# Patient Record
Sex: Female | Born: 1965 | Race: White | Hispanic: No
Health system: Southern US, Community
[De-identification: ages and names within clinical notes are randomized; demographics above are authoritative.]

## PROBLEM LIST (undated history)

## (undated) DIAGNOSIS — J9621 Acute and chronic respiratory failure with hypoxia: Secondary | ICD-10-CM

## (undated) DIAGNOSIS — J4489 Other specified chronic obstructive pulmonary disease: Secondary | ICD-10-CM

## (undated) DIAGNOSIS — J449 Chronic obstructive pulmonary disease, unspecified: Secondary | ICD-10-CM

## (undated) DIAGNOSIS — Z86718 Personal history of other venous thrombosis and embolism: Secondary | ICD-10-CM

## (undated) DIAGNOSIS — J690 Pneumonitis due to inhalation of food and vomit: Secondary | ICD-10-CM

## (undated) DIAGNOSIS — F1920 Other psychoactive substance dependence, uncomplicated: Secondary | ICD-10-CM

---

## 2016-06-12 ENCOUNTER — Other Ambulatory Visit (HOSPITAL_COMMUNITY): Payer: Medicare Other

## 2016-06-12 ENCOUNTER — Inpatient Hospital Stay
Admission: RE | Admit: 2016-06-12 | Discharge: 2016-06-30 | Disposition: A | Discharge status: Skilled Nursing Facility | Payer: Medicare Other | Attending: Internal Medicine | Admitting: Internal Medicine

## 2016-06-12 DIAGNOSIS — Z4659 Encounter for fitting and adjustment of other gastrointestinal appliance and device: Secondary | ICD-10-CM

## 2016-06-12 DIAGNOSIS — J9690 Respiratory failure, unspecified, unspecified whether with hypoxia or hypercapnia: Secondary | ICD-10-CM

## 2016-06-12 DIAGNOSIS — Z0189 Encounter for other specified special examinations: Secondary | ICD-10-CM

## 2016-06-13 ENCOUNTER — Other Ambulatory Visit (HOSPITAL_COMMUNITY): Payer: Medicare Other

## 2016-06-13 LAB — COMPREHENSIVE METABOLIC PANEL
ALT: 362 U/L — ABNORMAL HIGH (ref 14–54)
AST: 342 U/L — ABNORMAL HIGH (ref 15–41)
Albumin: 2.3 g/dL — ABNORMAL LOW (ref 3.5–5.0)
Alkaline Phosphatase: 200 U/L — ABNORMAL HIGH (ref 38–126)
Anion gap: 9 (ref 5–15)
BUN: 22 mg/dL — ABNORMAL HIGH (ref 6–20)
CO2: 28 mmol/L (ref 22–32)
Calcium: 9.8 mg/dL (ref 8.9–10.3)
Chloride: 104 mmol/L (ref 101–111)
Creatinine, Ser: 0.52 mg/dL (ref 0.44–1.00)
GFR calc Af Amer: >60 mL/min (ref >60)
GFR calc non Af Amer: >60 mL/min (ref >60)
Glucose, Bld: 93 mg/dL (ref 65–99)
Potassium: 3.7 mmol/L (ref 3.5–5.1)
Sodium: 141 mmol/L (ref 135–145)
Total Bilirubin: 0.6 mg/dL (ref 0.3–1.2)
Total Protein: 6.6 g/dL (ref 6.5–8.1)

## 2016-06-13 LAB — CBC
HCT: 33.2 % — ABNORMAL LOW (ref 36.0–46.0)
Hemoglobin: 10.7 g/dL — ABNORMAL LOW (ref 12.0–15.0)
MCH: 29.5 pg (ref 26.0–34.0)
MCHC: 32.2 g/dL (ref 30.0–36.0)
MCV: 91.5 fL (ref 78.0–100.0)
Platelets: 708 10*3/uL — ABNORMAL HIGH (ref 150–400)
RBC: 3.63 MIL/uL — ABNORMAL LOW (ref 3.87–5.11)
RDW: 16.3 % — ABNORMAL HIGH (ref 11.5–15.5)
WBC: 18.5 10*3/uL — ABNORMAL HIGH (ref 4.0–10.5)

## 2016-06-13 LAB — URINALYSIS, ROUTINE W REFLEX MICROSCOPIC
Bilirubin Urine: NEGATIVE
Glucose, UA: NEGATIVE mg/dL
Ketones, ur: NEGATIVE mg/dL
Leukocytes, UA: NEGATIVE
NITRITE: NEGATIVE
PH: 8 (ref 5.0–8.0)
Protein, ur: NEGATIVE mg/dL
SPECIFIC GRAVITY, URINE: 1.009 (ref 1.005–1.030)

## 2016-06-13 LAB — BLOOD GAS, ARTERIAL
Acid-Base Excess: 3.9 mmol/L — ABNORMAL HIGH (ref 0.0–2.0)
Acid-Base Excess: 2.4 mmol/L — ABNORMAL HIGH (ref 0.0–2.0)
Bicarbonate: 27 mmol/L (ref 20.0–28.0)
Bicarbonate: 25.1 mmol/L (ref 20.0–28.0)
O2 Content: 4 L/min
O2 Content: 3 L/min
O2 SAT: 89.2 %
O2 Saturation: 84.6 %
PCO2 ART: 30.3 mmHg — AB (ref 32.0–48.0)
Patient temperature: 97.6
Patient temperature: 98.6
pCO2 arterial: 33.6 mmHg (ref 32.0–48.0)
pH, Arterial: 7.513 — ABNORMAL HIGH (ref 7.350–7.450)
pH, Arterial: 7.529 — ABNORMAL HIGH (ref 7.350–7.450)
pO2, Arterial: 50.6 mmHg — ABNORMAL LOW (ref 83.0–108.0)
pO2, Arterial: 60.7 mmHg — ABNORMAL LOW (ref 83.0–108.0)

## 2016-06-13 LAB — HEPARIN LEVEL (UNFRACTIONATED)
HEPARIN UNFRACTIONATED: 0.48 [IU]/mL (ref 0.30–0.70)
Heparin Unfractionated: 0.27 IU/mL — ABNORMAL LOW (ref 0.30–0.70)
Heparin Unfractionated: 0.97 IU/mL — ABNORMAL HIGH (ref 0.30–0.70)

## 2016-06-13 LAB — PROTIME-INR
INR: 1.15
Prothrombin Time: 14.7 seconds (ref 11.4–15.2)

## 2016-06-13 LAB — PHOSPHORUS: Phosphorus: 2.7 mg/dL (ref 2.5–4.6)

## 2016-06-13 LAB — LIPASE, BLOOD: LIPASE: 33 U/L (ref 11–51)

## 2016-06-13 LAB — MAGNESIUM: Magnesium: 2.3 mg/dL (ref 1.7–2.4)

## 2016-06-13 LAB — T4, FREE: FREE T4: 1.16 ng/dL — AB (ref 0.61–1.12)

## 2016-06-13 LAB — TRIGLYCERIDES: Triglycerides: 347 mg/dL — ABNORMAL HIGH (ref <150)

## 2016-06-13 LAB — AMYLASE: AMYLASE: 91 U/L (ref 28–100)

## 2016-06-13 LAB — APTT: aPTT: 59 seconds — ABNORMAL HIGH (ref 24–36)

## 2016-06-14 LAB — MAGNESIUM: MAGNESIUM: 2.1 mg/dL (ref 1.7–2.4)

## 2016-06-14 LAB — CBC WITH DIFFERENTIAL/PLATELET
BASOS ABS: 0 10*3/uL (ref 0.0–0.1)
BASOS PCT: 0 %
EOS PCT: 0 %
Eosinophils Absolute: 0 10*3/uL (ref 0.0–0.7)
HCT: 34.5 % — ABNORMAL LOW (ref 36.0–46.0)
Hemoglobin: 11.1 g/dL — ABNORMAL LOW (ref 12.0–15.0)
Lymphocytes Relative: 21 %
Lymphs Abs: 2.7 10*3/uL (ref 0.7–4.0)
MCH: 28.8 pg (ref 26.0–34.0)
MCHC: 32.2 g/dL (ref 30.0–36.0)
MCV: 89.6 fL (ref 78.0–100.0)
MONO ABS: 0.9 10*3/uL (ref 0.1–1.0)
Monocytes Relative: 7 %
Neutro Abs: 9.5 10*3/uL — ABNORMAL HIGH (ref 1.7–7.7)
Neutrophils Relative %: 72 %
PLATELETS: 679 10*3/uL — AB (ref 150–400)
RBC: 3.85 MIL/uL — AB (ref 3.87–5.11)
RDW: 16.1 % — AB (ref 11.5–15.5)
WBC: 13.2 10*3/uL — AB (ref 4.0–10.5)

## 2016-06-14 LAB — TSH: TSH: 2.029 u[IU]/mL (ref 0.350–4.500)

## 2016-06-14 LAB — COMPREHENSIVE METABOLIC PANEL
ALT: 217 U/L — AB (ref 14–54)
AST: 64 U/L — AB (ref 15–41)
Albumin: 2.4 g/dL — ABNORMAL LOW (ref 3.5–5.0)
Alkaline Phosphatase: 193 U/L — ABNORMAL HIGH (ref 38–126)
Anion gap: 8 (ref 5–15)
BUN: 16 mg/dL (ref 6–20)
CO2: 24 mmol/L (ref 22–32)
CREATININE: 0.52 mg/dL (ref 0.44–1.00)
Calcium: 9.6 mg/dL (ref 8.9–10.3)
Chloride: 104 mmol/L (ref 101–111)
GFR calc Af Amer: >60 mL/min (ref >60)
Glucose, Bld: 124 mg/dL — ABNORMAL HIGH (ref 65–99)
POTASSIUM: 3.8 mmol/L (ref 3.5–5.1)
SODIUM: 136 mmol/L (ref 135–145)
Total Bilirubin: 0.4 mg/dL (ref 0.3–1.2)
Total Protein: 6.5 g/dL (ref 6.5–8.1)

## 2016-06-14 LAB — HEPARIN LEVEL (UNFRACTIONATED)
Heparin Unfractionated: <0.1 IU/mL — ABNORMAL LOW (ref 0.30–0.70)
Heparin Unfractionated: 0.76 IU/mL — ABNORMAL HIGH (ref 0.30–0.70)

## 2016-06-14 LAB — AMMONIA: AMMONIA: 29 umol/L (ref 9–35)

## 2016-06-14 LAB — URINE CULTURE: CULTURE: NO GROWTH

## 2016-06-14 LAB — PHOSPHORUS: PHOSPHORUS: 4.4 mg/dL (ref 2.5–4.6)

## 2016-06-14 LAB — PROCALCITONIN

## 2016-06-15 ENCOUNTER — Institutional Professional Consult (permissible substitution) (HOSPITAL_COMMUNITY): Payer: Medicare Other

## 2016-06-16 LAB — CBC WITH DIFFERENTIAL/PLATELET
Basophils Absolute: 0 10*3/uL (ref 0.0–0.1)
Basophils Relative: 0 %
Eosinophils Absolute: 0.2 10*3/uL (ref 0.0–0.7)
Eosinophils Relative: 1 %
HCT: 40 % (ref 36.0–46.0)
Hemoglobin: 12.6 g/dL (ref 12.0–15.0)
LYMPHS ABS: 3 10*3/uL (ref 0.7–4.0)
Lymphocytes Relative: 17 %
MCH: 28.8 pg (ref 26.0–34.0)
MCHC: 31.5 g/dL (ref 30.0–36.0)
MCV: 91.5 fL (ref 78.0–100.0)
MONO ABS: 1.3 10*3/uL — AB (ref 0.1–1.0)
MONOS PCT: 7 %
NEUTROS PCT: 75 %
Neutro Abs: 13.2 10*3/uL — ABNORMAL HIGH (ref 1.7–7.7)
Platelets: 563 10*3/uL — ABNORMAL HIGH (ref 150–400)
RBC: 4.37 MIL/uL (ref 3.87–5.11)
RDW: 16.5 % — AB (ref 11.5–15.5)
WBC: 17.7 10*3/uL — ABNORMAL HIGH (ref 4.0–10.5)

## 2016-06-16 LAB — COMPREHENSIVE METABOLIC PANEL
ALT: 120 U/L — ABNORMAL HIGH (ref 14–54)
ANION GAP: 9 (ref 5–15)
AST: 26 U/L (ref 15–41)
Albumin: 2.5 g/dL — ABNORMAL LOW (ref 3.5–5.0)
Alkaline Phosphatase: 218 U/L — ABNORMAL HIGH (ref 38–126)
BILIRUBIN TOTAL: 0.8 mg/dL (ref 0.3–1.2)
BUN: 19 mg/dL (ref 6–20)
CO2: 23 mmol/L (ref 22–32)
CREATININE: 0.54 mg/dL (ref 0.44–1.00)
Calcium: 9.8 mg/dL (ref 8.9–10.3)
Chloride: 107 mmol/L (ref 101–111)
GFR calc non Af Amer: >60 mL/min (ref >60)
GLUCOSE: 127 mg/dL — AB (ref 65–99)
Potassium: 3.8 mmol/L (ref 3.5–5.1)
Sodium: 139 mmol/L (ref 135–145)
Total Protein: 7 g/dL (ref 6.5–8.1)

## 2016-06-16 LAB — EXPECTORATED SPUTUM ASSESSMENT W GRAM STAIN, RFLX TO RESP C

## 2016-06-16 LAB — PHOSPHORUS: Phosphorus: 4.1 mg/dL (ref 2.5–4.6)

## 2016-06-16 LAB — EXPECTORATED SPUTUM ASSESSMENT W REFEX TO RESP CULTURE

## 2016-06-16 LAB — MAGNESIUM: Magnesium: 2.2 mg/dL (ref 1.7–2.4)

## 2016-06-20 LAB — CBC WITH DIFFERENTIAL/PLATELET
Basophils Absolute: 0 10*3/uL (ref 0.0–0.1)
Basophils Relative: 0 %
EOS PCT: 2 %
Eosinophils Absolute: 0.1 10*3/uL (ref 0.0–0.7)
HCT: 39.3 % (ref 36.0–46.0)
HEMOGLOBIN: 12 g/dL (ref 12.0–15.0)
LYMPHS ABS: 2.2 10*3/uL (ref 0.7–4.0)
LYMPHS PCT: 26 %
MCH: 29.1 pg (ref 26.0–34.0)
MCHC: 30.5 g/dL (ref 30.0–36.0)
MCV: 95.4 fL (ref 78.0–100.0)
MONOS PCT: 9 %
Monocytes Absolute: 0.8 10*3/uL (ref 0.1–1.0)
Neutro Abs: 5.3 10*3/uL (ref 1.7–7.7)
Neutrophils Relative %: 63 %
Platelets: 348 10*3/uL (ref 150–400)
RBC: 4.12 MIL/uL (ref 3.87–5.11)
RDW: 16.8 % — ABNORMAL HIGH (ref 11.5–15.5)
WBC: 8.5 10*3/uL (ref 4.0–10.5)

## 2016-06-20 LAB — URINALYSIS, ROUTINE W REFLEX MICROSCOPIC
Bilirubin Urine: NEGATIVE
GLUCOSE, UA: NEGATIVE mg/dL
Hgb urine dipstick: NEGATIVE
KETONES UR: NEGATIVE mg/dL
LEUKOCYTES UA: NEGATIVE
NITRITE: NEGATIVE
PH: 7 (ref 5.0–8.0)
Protein, ur: NEGATIVE mg/dL
SPECIFIC GRAVITY, URINE: 1.018 (ref 1.005–1.030)

## 2016-06-20 LAB — BASIC METABOLIC PANEL
Anion gap: 10 (ref 5–15)
BUN: 20 mg/dL (ref 6–20)
CHLORIDE: 114 mmol/L — AB (ref 101–111)
CO2: 25 mmol/L (ref 22–32)
CREATININE: 0.53 mg/dL (ref 0.44–1.00)
Calcium: 10 mg/dL (ref 8.9–10.3)
GFR calc Af Amer: >60 mL/min (ref >60)
GFR calc non Af Amer: >60 mL/min (ref >60)
GLUCOSE: 115 mg/dL — AB (ref 65–99)
Potassium: 3.3 mmol/L — ABNORMAL LOW (ref 3.5–5.1)
SODIUM: 149 mmol/L — AB (ref 135–145)

## 2016-06-20 LAB — MAGNESIUM: MAGNESIUM: 2 mg/dL (ref 1.7–2.4)

## 2016-06-20 LAB — PHOSPHORUS: Phosphorus: 4.1 mg/dL (ref 2.5–4.6)

## 2016-06-21 ENCOUNTER — Other Ambulatory Visit (HOSPITAL_COMMUNITY): Payer: Medicare Other

## 2016-06-21 LAB — BASIC METABOLIC PANEL
ANION GAP: 7 (ref 5–15)
BUN: 14 mg/dL (ref 6–20)
CHLORIDE: 111 mmol/L (ref 101–111)
CO2: 26 mmol/L (ref 22–32)
Calcium: 10 mg/dL (ref 8.9–10.3)
Creatinine, Ser: 0.4 mg/dL — ABNORMAL LOW (ref 0.44–1.00)
GFR calc Af Amer: >60 mL/min (ref >60)
GFR calc non Af Amer: >60 mL/min (ref >60)
GLUCOSE: 128 mg/dL — AB (ref 65–99)
POTASSIUM: 3.6 mmol/L (ref 3.5–5.1)
Sodium: 144 mmol/L (ref 135–145)

## 2016-06-21 LAB — URINE CULTURE: Culture: NO GROWTH

## 2016-06-25 LAB — BASIC METABOLIC PANEL
Anion gap: 7 (ref 5–15)
BUN: 7 mg/dL (ref 6–20)
CALCIUM: 10 mg/dL (ref 8.9–10.3)
CO2: 23 mmol/L (ref 22–32)
Chloride: 108 mmol/L (ref 101–111)
Creatinine, Ser: 0.41 mg/dL — ABNORMAL LOW (ref 0.44–1.00)
GFR calc Af Amer: >60 mL/min (ref >60)
GLUCOSE: 102 mg/dL — AB (ref 65–99)
Potassium: 3.4 mmol/L — ABNORMAL LOW (ref 3.5–5.1)
Sodium: 138 mmol/L (ref 135–145)

## 2016-06-27 LAB — BASIC METABOLIC PANEL
ANION GAP: 11 (ref 5–15)
BUN: 6 mg/dL (ref 6–20)
CHLORIDE: 109 mmol/L (ref 101–111)
CO2: 16 mmol/L — ABNORMAL LOW (ref 22–32)
Calcium: 10.3 mg/dL (ref 8.9–10.3)
Creatinine, Ser: 0.44 mg/dL (ref 0.44–1.00)
GFR calc Af Amer: >60 mL/min (ref >60)
GFR calc non Af Amer: >60 mL/min (ref >60)
Glucose, Bld: 80 mg/dL (ref 65–99)
POTASSIUM: 4.1 mmol/L (ref 3.5–5.1)
SODIUM: 136 mmol/L (ref 135–145)

## 2016-06-27 LAB — PHOSPHORUS: PHOSPHORUS: 4.3 mg/dL (ref 2.5–4.6)

## 2018-08-16 ENCOUNTER — Other Ambulatory Visit (HOSPITAL_COMMUNITY): Payer: Medicare Other

## 2018-08-16 ENCOUNTER — Inpatient Hospital Stay
Admit: 2018-08-16 | Discharge: 2018-09-06 | Disposition: A | Discharge status: Skilled Nursing Facility | Payer: Medicare Other | Attending: Internal Medicine | Admitting: Internal Medicine

## 2018-08-16 DIAGNOSIS — Z9289 Personal history of other medical treatment: Secondary | ICD-10-CM

## 2018-08-16 DIAGNOSIS — F1920 Other psychoactive substance dependence, uncomplicated: Secondary | ICD-10-CM | POA: Diagnosis present

## 2018-08-16 DIAGNOSIS — J4489 Other specified chronic obstructive pulmonary disease: Secondary | ICD-10-CM | POA: Diagnosis present

## 2018-08-16 DIAGNOSIS — J449 Chronic obstructive pulmonary disease, unspecified: Secondary | ICD-10-CM | POA: Diagnosis present

## 2018-08-16 DIAGNOSIS — Z0189 Encounter for other specified special examinations: Secondary | ICD-10-CM

## 2018-08-16 DIAGNOSIS — J690 Pneumonitis due to inhalation of food and vomit: Secondary | ICD-10-CM | POA: Diagnosis present

## 2018-08-16 DIAGNOSIS — J9690 Respiratory failure, unspecified, unspecified whether with hypoxia or hypercapnia: Secondary | ICD-10-CM

## 2018-08-16 DIAGNOSIS — J9621 Acute and chronic respiratory failure with hypoxia: Secondary | ICD-10-CM | POA: Diagnosis present

## 2018-08-16 DIAGNOSIS — T148XXA Other injury of unspecified body region, initial encounter: Secondary | ICD-10-CM

## 2018-08-16 DIAGNOSIS — Z86718 Personal history of other venous thrombosis and embolism: Secondary | ICD-10-CM

## 2018-08-16 HISTORY — DX: Acute and chronic respiratory failure with hypoxia: J96.21

## 2018-08-16 HISTORY — DX: Chronic obstructive pulmonary disease, unspecified: J44.9

## 2018-08-16 HISTORY — DX: Other specified chronic obstructive pulmonary disease: J44.89

## 2018-08-16 HISTORY — DX: Personal history of other venous thrombosis and embolism: Z86.718

## 2018-08-16 HISTORY — DX: Other psychoactive substance dependence, uncomplicated: F19.20

## 2018-08-16 HISTORY — DX: Pneumonitis due to inhalation of food and vomit: J69.0

## 2018-08-16 LAB — BLOOD GAS, ARTERIAL
Acid-Base Excess: 8.2 mmol/L — ABNORMAL HIGH (ref 0.0–2.0)
Bicarbonate: 32 mmol/L — ABNORMAL HIGH (ref 20.0–28.0)
FIO2: 50
MECHVT: 450 mL
O2 Saturation: 91.9 %
PEEP: 10 cmH2O
Patient temperature: 98.1
RATE: 30 resp/min
pCO2 arterial: 41.8 mmHg (ref 32.0–48.0)
pH, Arterial: 7.494 — ABNORMAL HIGH (ref 7.350–7.450)
pO2, Arterial: 60.5 mmHg — ABNORMAL LOW (ref 83.0–108.0)

## 2018-08-17 LAB — COMPREHENSIVE METABOLIC PANEL
ALT: 101 U/L — ABNORMAL HIGH (ref 0–44)
AST: 107 U/L — ABNORMAL HIGH (ref 15–41)
Albumin: 1.8 g/dL — ABNORMAL LOW (ref 3.5–5.0)
Alkaline Phosphatase: 108 U/L (ref 38–126)
Anion gap: 9 (ref 5–15)
BUN: 25 mg/dL — ABNORMAL HIGH (ref 6–20)
CO2: 27 mmol/L (ref 22–32)
Calcium: 8.9 mg/dL (ref 8.9–10.3)
Chloride: 105 mmol/L (ref 98–111)
Creatinine, Ser: 0.93 mg/dL (ref 0.44–1.00)
GFR calc Af Amer: >60 mL/min (ref >60)
GFR calc non Af Amer: >60 mL/min (ref >60)
Glucose, Bld: 89 mg/dL (ref 70–99)
Potassium: 4.2 mmol/L (ref 3.5–5.1)
Sodium: 141 mmol/L (ref 135–145)
Total Bilirubin: 0.6 mg/dL (ref 0.3–1.2)
Total Protein: 4.9 g/dL — ABNORMAL LOW (ref 6.5–8.1)

## 2018-08-17 LAB — CBC WITH DIFFERENTIAL/PLATELET
Abs Immature Granulocytes: 0.53 10*3/uL — ABNORMAL HIGH (ref 0.00–0.07)
Basophils Absolute: 0 10*3/uL (ref 0.0–0.1)
Basophils Relative: 0 %
Eosinophils Absolute: 0 10*3/uL (ref 0.0–0.5)
Eosinophils Relative: 0 %
HCT: 26.2 % — ABNORMAL LOW (ref 36.0–46.0)
Hemoglobin: 8.4 g/dL — ABNORMAL LOW (ref 12.0–15.0)
Immature Granulocytes: 5 %
Lymphocytes Relative: 11 %
Lymphs Abs: 1.3 10*3/uL (ref 0.7–4.0)
MCH: 30.7 pg (ref 26.0–34.0)
MCHC: 32.1 g/dL (ref 30.0–36.0)
MCV: 95.6 fL (ref 80.0–100.0)
Monocytes Absolute: 0.5 10*3/uL (ref 0.1–1.0)
Monocytes Relative: 4 %
Neutro Abs: 9.4 10*3/uL — ABNORMAL HIGH (ref 1.7–7.7)
Neutrophils Relative %: 80 %
Platelets: 313 10*3/uL (ref 150–400)
RBC: 2.74 MIL/uL — ABNORMAL LOW (ref 3.87–5.11)
RDW: 14.4 % (ref 11.5–15.5)
WBC: 11.8 10*3/uL — ABNORMAL HIGH (ref 4.0–10.5)
nRBC: 0.2 % (ref 0.0–0.2)

## 2018-08-17 LAB — PROTIME-INR
INR: 1 (ref 0.8–1.2)
Prothrombin Time: 13.1 seconds (ref 11.4–15.2)

## 2018-08-17 LAB — APTT: aPTT: 24 seconds (ref 24–36)

## 2018-08-18 LAB — BLOOD GAS, ARTERIAL
Acid-Base Excess: 1 mmol/L (ref 0.0–2.0)
Bicarbonate: 24.8 mmol/L (ref 20.0–28.0)
FIO2: 40
MECHVT: 450 mL
O2 Saturation: 94.9 %
PEEP: 5 cmH2O
Patient temperature: 98.6
RATE: 23 resp/min
pCO2 arterial: 37.9 mmHg (ref 32.0–48.0)
pH, Arterial: 7.431 (ref 7.350–7.450)
pO2, Arterial: 77.7 mmHg — ABNORMAL LOW (ref 83.0–108.0)

## 2018-08-18 LAB — CBC
HCT: 29.4 % — ABNORMAL LOW (ref 36.0–46.0)
Hemoglobin: 9.3 g/dL — ABNORMAL LOW (ref 12.0–15.0)
MCH: 30.3 pg (ref 26.0–34.0)
MCHC: 31.6 g/dL (ref 30.0–36.0)
MCV: 95.8 fL (ref 80.0–100.0)
Platelets: 368 10*3/uL (ref 150–400)
RBC: 3.07 MIL/uL — ABNORMAL LOW (ref 3.87–5.11)
RDW: 14.1 % (ref 11.5–15.5)
WBC: 16.1 10*3/uL — ABNORMAL HIGH (ref 4.0–10.5)
nRBC: 0 % (ref 0.0–0.2)

## 2018-08-19 DIAGNOSIS — Z86718 Personal history of other venous thrombosis and embolism: Secondary | ICD-10-CM

## 2018-08-19 DIAGNOSIS — F1920 Other psychoactive substance dependence, uncomplicated: Secondary | ICD-10-CM

## 2018-08-19 DIAGNOSIS — J449 Chronic obstructive pulmonary disease, unspecified: Secondary | ICD-10-CM

## 2018-08-19 DIAGNOSIS — J9621 Acute and chronic respiratory failure with hypoxia: Secondary | ICD-10-CM

## 2018-08-19 DIAGNOSIS — J690 Pneumonitis due to inhalation of food and vomit: Secondary | ICD-10-CM

## 2018-08-19 LAB — BASIC METABOLIC PANEL
Anion gap: 7 (ref 5–15)
BUN: 15 mg/dL (ref 6–20)
CO2: 23 mmol/L (ref 22–32)
Calcium: 9.2 mg/dL (ref 8.9–10.3)
Chloride: 110 mmol/L (ref 98–111)
Creatinine, Ser: 0.78 mg/dL (ref 0.44–1.00)
GFR calc Af Amer: >60 mL/min (ref >60)
GFR calc non Af Amer: >60 mL/min (ref >60)
Glucose, Bld: 115 mg/dL — ABNORMAL HIGH (ref 70–99)
Potassium: 4.3 mmol/L (ref 3.5–5.1)
Sodium: 140 mmol/L (ref 135–145)

## 2018-08-19 NOTE — Consult Note (Signed)
Pulmonary Critical Care [facility]  Date of Service: 08/19/2018  PULMONARY CRITICAL CARE Kateri PlummerCONSULT   Dana Parker  QMV:[id number]  DOB: February 27, 1966   DOA: 08/16/2018  Referring Physician: Carron CurieAli Hijazi, MD  HPI: Dana Parker is a 53 y.o. female seen for follow up of Acute on Chronic Respiratory Failure.  Patient has multiple medical problems including fibromyalgia dysphagia depression aspiration pneumonia.  Patient was found unresponsive apparently has a history of opioid use.  Patient chest x-ray showed to be unremarkable on admission however was significantly hypoxic and was intubated on the ventilator.  Further evaluation included a CT scan which was unremarkable.  Patient apparently did have a vomiting episode during her hospitalization and developed an aspiration pneumonia which was seen on his follow-up chest x-ray.  Now presents to our facility for further management currently is on the ventilator  Review of Systems:  ROS performed and is unremarkable other than noted above.  Past medical history Fibromyalgia Depression Aspiration Polysubstance abuse DVT Respiratory failure COPD  Past surgical history Status post ORIF right femur Colostomy Ischemic colitis  Family history: Mention of heart disease  Social history: Positive for tobacco use 1.5 packs/day Polysubstance abuse Opiate abuse  Physical exam: Temperature 98.6 pulse 80 respiratory rate 29 blood pressure 144/80 saturations 95%  Ventilator settings: Patient was on the ventilator on pressure support mode on 35% FiO2 8/5  Physical examination:  General: Comfortable at this time  Eyes: Grossly normal lids, irises & conjunctiva  ENT: grossly tongue is normal  Neck: no obvious mass  Cardiovascular: S1-S2 normal no gallop or rub  Respiratory: Coarse breath sounds with a few rhonchi  Abdomen: Soft and nontender  Skin: no rash seen on limited  exam  Musculoskeletal: not rigid  Psychiatric:unable to assess  Neurologic: no seizure no involuntary movements  Medications: [facility]        Labs on Admission:  Basic Metabolic Panel: Recent Labs  Lab 08/17/18 1046 08/19/18 1002  NA 141 140  K 4.2 4.3  CL 105 110  CO2 27 23  GLUCOSE 89 115*  BUN 25* 15  CREATININE 0.93 0.78  CALCIUM 8.9 9.2    Recent Labs  Lab 08/16/18 1628 08/18/18 1305  PHART 7.494* 7.431  PCO2ART 41.8 37.9  PO2ART 60.5* 77.7*  HCO3 32.0* 24.8  O2SAT 91.9 94.9    Liver Function Tests: Recent Labs  Lab 08/17/18 1046  AST 107*  ALT 101*  ALKPHOS 108  BILITOT 0.6  PROT 4.9*  ALBUMIN 1.8*   No results for input(s): LIPASE, AMYLASE in the last 168 hours. No results for input(s): AMMONIA in the last 168 hours.  CBC: Recent Labs  Lab 08/17/18 1046 08/18/18 1128  WBC 11.8* 16.1*  NEUTROABS 9.4*  --   HGB 8.4* 9.3*  HCT 26.2* 29.4*  MCV 95.6 95.8  PLT 313 368    Cardiac Enzymes: No results for input(s): CKTOTAL, CKMB, CKMBINDEX, TROPONINI in the last 168 hours.  BNP (last 3 results) No results for input(s): BNP in the last 8760 hours.  ProBNP (last 3 results) No results for input(s): PROBNP in the last 8760 hours.   Radiological Exams on Admission: Dg Abd 1 View  Result Date: 08/16/2018 CLINICAL DATA:  Orogastric tube placement. EXAM: ABDOMEN - 1 VIEW COMPARISON:  None. FINDINGS: Tip and side port of the enteric tube below the diaphragm in the stomach. Overall paucity of bowel gas in the abdomen. Surgical clips in the right and  left abdomen. There are vascular calcifications. Bilateral hip arthroplasties are partially included. IMPRESSION: Tip and side port of the enteric tube below the diaphragm in the stomach. Electronically Signed   By: Keith Rake M.D.   On: 08/16/2018 20:36   Dg Chest Port 1 View  Result Date: 08/16/2018 CLINICAL DATA:  History of endotracheal tube. EXAM: PORTABLE CHEST 1 VIEW  COMPARISON:  Radiograph 06/13/2016 FINDINGS: Endotracheal tube tip 4.7 cm from the carina. Enteric tube in place with tip and side-port below the diaphragm in the stomach. Right upper extremity PICC tip in the lower SVC. Normal heart size and mediastinal contours. Diffuse interstitial coarsening. Bilateral upper lobe scarring. Patchy and streaky opacities in the mid and lower lung zones. Possible small pleural effusions. No pulmonary edema. No pneumothorax. Remote left rib fractures. IMPRESSION: 1. Endotracheal tube tip 4.7 cm from the carina. Enteric tube in place. Right upper extremity PICC tip in the lower SVC. 2. Nonspecific patchy and streaky opacities in the mid and lower lung zones may be atelectasis, aspiration or pneumonia. Possible small pleural effusions. Electronically Signed   By: Keith Rake M.D.   On: 08/16/2018 20:35    Assessment/Plan Active Problems:   Acute on chronic respiratory failure with hypoxia (HCC)   Polysubstance dependence including opioid type drug with complication, episodic abuse ([place])   Aspiration pneumonia due to gastric secretions (HCC)   Obstructive chronic bronchitis without exacerbation (HCC)   History of DVT of lower extremity   1. Acute on chronic respiratory failure with hypoxia patient remains orally intubated critically ill has a high risk airway right now is tolerating the wean on pressure support is on 40% FiO2 the goal today was to try for about 2 hours on pressure support wean 2. Polysubstance abuse we will need to watch for active withdrawal continue with supportive care 3. Aspiration pneumonia patient has been treated with Zosyn we will continue with supportive care. 4. COPD by history patient will get nebulizers as necessary. 5. DVT history continue with on supportive care patient had been on a heparin drip which was stopped because of GI bleed  I have personally seen and evaluated the patient, evaluated laboratory and imaging results,  formulated the assessment and plan and placed orders. The Patient requires high complexity decision making for assessment and support.  Case was discussed on Rounds with the Respiratory Therapy Staff Time Spent 52minutes  Allyne Gee, MD [facility] Pulmonary Critical Care Medicine Sleep Medicine

## 2018-08-20 DIAGNOSIS — J449 Chronic obstructive pulmonary disease, unspecified: Secondary | ICD-10-CM | POA: Diagnosis not present

## 2018-08-20 DIAGNOSIS — J9621 Acute and chronic respiratory failure with hypoxia: Secondary | ICD-10-CM | POA: Diagnosis not present

## 2018-08-20 DIAGNOSIS — Z86718 Personal history of other venous thrombosis and embolism: Secondary | ICD-10-CM | POA: Diagnosis not present

## 2018-08-20 DIAGNOSIS — J690 Pneumonitis due to inhalation of food and vomit: Secondary | ICD-10-CM | POA: Diagnosis not present

## 2018-08-20 NOTE — Progress Notes (Signed)
Pulmonary Critical Care Medicine [place]   PULMONARY CRITICAL CARE SERVICE  PROGRESS NOTE  Date of Service: 08/20/2018  Dana Parker  [id number]  DOB: 1965/03/19   DOA: 08/16/2018  Referring Physician: Merton Border, MD  HPI: Dana Parker is a 53 y.o. female seen for follow up of Acute on Chronic Respiratory Failure.  Patient is weaning currently on pressure support mode has been on 40% FiO2  Medications: Reviewed on Rounds  Physical Exam:  Vitals: Temperature 98.6 pulse 81 respiratory rate 22 blood pressure 110/62 saturations 100%  Ventilator Settings mode ventilation pressure support FiO2 40% pressure poor 12 PEEP 5  . General: Comfortable at this time . Eyes: Grossly normal lids, irises & conjunctiva . ENT: grossly tongue is normal . Neck: no obvious mass . Cardiovascular: S1 S2 normal no gallop . Respiratory: No rhonchi no rales are noted at this time . Abdomen: soft . Skin: no rash seen on limited exam . Musculoskeletal: not rigid . Psychiatric:unable to assess . Neurologic: no seizure no involuntary movements         Lab Data:   Basic Metabolic Panel: Recent Labs  Lab 08/17/18 1046 08/19/18 1002  NA 141 140  K 4.2 4.3  CL 105 110  CO2 27 23  GLUCOSE 89 115*  BUN 25* 15  CREATININE 0.93 0.78  CALCIUM 8.9 9.2    ABG: Recent Labs  Lab 08/16/18 1628 08/18/18 1305  PHART 7.494* 7.431  PCO2ART 41.8 37.9  PO2ART 60.5* 77.7*  HCO3 32.0* 24.8  O2SAT 91.9 94.9    Liver Function Tests: Recent Labs  Lab 08/17/18 1046  AST 107*  ALT 101*  ALKPHOS 108  BILITOT 0.6  PROT 4.9*  ALBUMIN 1.8*   No results for input(s): LIPASE, AMYLASE in the last 168 hours. No results for input(s): AMMONIA in the last 168 hours.  CBC: Recent Labs  Lab 08/17/18 1046 08/18/18 1128  WBC 11.8* 16.1*  NEUTROABS 9.4*  --   HGB 8.4* 9.3*  HCT 26.2* 29.4*  MCV 95.6 95.8  PLT 313 368    Cardiac Enzymes: No results for input(s):  CKTOTAL, CKMB, CKMBINDEX, TROPONINI in the last 168 hours.  BNP (last 3 results) No results for input(s): BNP in the last 8760 hours.  ProBNP (last 3 results) No results for input(s): PROBNP in the last 8760 hours.  Radiological Exams: No results found.  Assessment/Plan Active Problems:   Acute on chronic respiratory failure with hypoxia (HCC)   Polysubstance dependence including opioid type drug with complication, episodic abuse ([place])   Aspiration pneumonia due to gastric secretions (HCC)   Obstructive chronic bronchitis without exacerbation (HCC)   History of DVT of lower extremity   1. Acute on chronic respiratory failure with hypoxia patient is doing well with weaning on pressure support the goal is for 4 hours today will continue to advance as tolerated 2. Polysubstance abuse patient is at baseline we will continue with supportive care 3. Aspiration pneumonia treated we will continue with present management 4. COPD at baseline nebulizers as necessary 5. History of DVT anticoagulation as tolerated   I have personally seen and evaluated the patient, evaluated laboratory and imaging results, formulated the assessment and plan and placed orders. The Patient requires high complexity decision making for assessment and support.  Case was discussed on Rounds with the Respiratory Therapy Staff  Allyne Gee, MD [facility] Pulmonary Critical Care Medicine Sleep Medicine

## 2018-08-22 ENCOUNTER — Other Ambulatory Visit (HOSPITAL_COMMUNITY): Payer: Medicare Other

## 2018-08-22 ENCOUNTER — Encounter: Payer: Self-pay | Admitting: Internal Medicine

## 2018-08-22 DIAGNOSIS — J9621 Acute and chronic respiratory failure with hypoxia: Secondary | ICD-10-CM | POA: Diagnosis present

## 2018-08-22 DIAGNOSIS — J690 Pneumonitis due to inhalation of food and vomit: Secondary | ICD-10-CM | POA: Diagnosis not present

## 2018-08-22 DIAGNOSIS — J449 Chronic obstructive pulmonary disease, unspecified: Secondary | ICD-10-CM | POA: Diagnosis not present

## 2018-08-22 DIAGNOSIS — F1920 Other psychoactive substance dependence, uncomplicated: Secondary | ICD-10-CM | POA: Diagnosis present

## 2018-08-22 DIAGNOSIS — Z86718 Personal history of other venous thrombosis and embolism: Secondary | ICD-10-CM

## 2018-08-22 NOTE — Progress Notes (Signed)
Pulmonary Critical Care Medicine [place]   PULMONARY CRITICAL CARE SERVICE  PROGRESS NOTE  Date of Service: 08/22/2018  [name]  [id number]  DOB: [birth year]   DOA: 08/16/2018  Referring Physician: Merton Border, MD  HPI: Dana Parker is a 53 y.o. female seen for follow up of Acute on Chronic Respiratory Failure.  She is weaning on pressure support doing very well.  Patient has had good volumes noted  Medications: Reviewed on Rounds  Physical Exam:  Vitals: Temperature 98.3 pulse 75 respiratory 22 blood pressure 144/80 saturations 95%  Ventilator Settings mode ventilation pressure support FiO2 35% pressure support 8 PEEP 5  . General: Comfortable at this time . Eyes: Grossly normal lids, irises & conjunctiva . ENT: grossly tongue is normal . Neck: no obvious mass . Cardiovascular: S1 S2 normal no gallop . Respiratory: No rhonchi no rales are noted at this time . Abdomen: soft . Skin: no rash seen on limited exam . Musculoskeletal: not rigid . Psychiatric:unable to assess . Neurologic: no seizure no involuntary movements         Lab Data:   Basic Metabolic Panel: Recent Labs  Lab 08/17/18 1046 08/19/18 1002  NA 141 140  K 4.2 4.3  CL 105 110  CO2 27 23  GLUCOSE 89 115*  BUN 25* 15  CREATININE 0.93 0.78  CALCIUM 8.9 9.2    ABG: Recent Labs  Lab 08/16/18 1628 08/18/18 1305  PHART 7.494* 7.431  PCO2ART 41.8 37.9  PO2ART 60.5* 77.7*  HCO3 32.0* 24.8  O2SAT 91.9 94.9    Liver Function Tests: Recent Labs  Lab 08/17/18 1046  AST 107*  ALT 101*  ALKPHOS 108  BILITOT 0.6  PROT 4.9*  ALBUMIN 1.8*   No results for input(s): LIPASE, AMYLASE in the last 168 hours. No results for input(s): AMMONIA in the last 168 hours.  CBC: Recent Labs  Lab 08/17/18 1046 08/18/18 1128  WBC 11.8* 16.1*  NEUTROABS 9.4*  --   HGB 8.4* 9.3*  HCT 26.2* 29.4*  MCV 95.6 95.8  PLT 313 368    Cardiac Enzymes: No results for  input(s): CKTOTAL, CKMB, CKMBINDEX, TROPONINI in the last 168 hours.  BNP (last 3 results) No results for input(s): BNP in the last 8760 hours.  ProBNP (last 3 results) No results for input(s): PROBNP in the last 8760 hours.  Radiological Exams: No results found.  Assessment/Plan Active Problems:   Acute on chronic respiratory failure with hypoxia (HCC)   Polysubstance dependence including opioid type drug with complication, episodic abuse ([place])   Aspiration pneumonia due to gastric secretions (HCC)   Obstructive chronic bronchitis without exacerbation (HCC)   History of DVT of lower extremity   1. Acute on chronic respiratory failure with hypoxia we will continue with weaning on pressure support the goal is for 16 hours today doing actually very well 2. Placed substance abuse stable we will continue with supportive care 3. Aspiration pneumonia clinically improving 4. Obstructive chronic bronchitis at baseline we will continue with present management 5. History of DVT at baseline continue supportive care   I have personally seen and evaluated the patient, evaluated laboratory and imaging results, formulated the assessment and plan and placed orders. The Patient requires high complexity decision making for assessment and support.  Case was discussed on Rounds with the Respiratory Therapy Staff  Allyne Gee, MD [facility] Pulmonary Critical Care Medicine Sleep Medicine

## 2018-08-23 DIAGNOSIS — J690 Pneumonitis due to inhalation of food and vomit: Secondary | ICD-10-CM | POA: Diagnosis not present

## 2018-08-23 DIAGNOSIS — J9621 Acute and chronic respiratory failure with hypoxia: Secondary | ICD-10-CM | POA: Diagnosis not present

## 2018-08-23 DIAGNOSIS — Z86718 Personal history of other venous thrombosis and embolism: Secondary | ICD-10-CM | POA: Diagnosis not present

## 2018-08-23 DIAGNOSIS — J449 Chronic obstructive pulmonary disease, unspecified: Secondary | ICD-10-CM | POA: Diagnosis not present

## 2018-08-23 LAB — BLOOD GAS, ARTERIAL
Acid-base deficit: 0.8 mmol/L (ref 0.0–2.0)
Bicarbonate: 23 mmol/L (ref 20.0–28.0)
FIO2: 0.28
O2 Saturation: 95.9 %
PEEP: 5 cmH2O
Patient temperature: 98.6
Pressure support: 5 cmH2O
pCO2 arterial: 35.6 mmHg (ref 32.0–48.0)
pH, Arterial: 7.425 (ref 7.350–7.450)
pO2, Arterial: 77.4 mmHg — ABNORMAL LOW (ref 83.0–108.0)

## 2018-08-23 NOTE — Progress Notes (Signed)
Pulmonary Critical Care Medicine [place]   PULMONARY CRITICAL CARE SERVICE  PROGRESS NOTE  Date of Service: 08/23/2018  Dana Parker  [id number]  DOB: [birth year]   DOA: 08/16/2018  Referring Physician: Merton Border, MD  HPI: Dana Parker is a 53 y.o. female seen for follow up of Acute on Chronic Respiratory Failure.  Patient is actually doing very well on pressure support mode is been on 28% FiO2 was placed on a pressure of 5/5 all smaller with good volumes and results.  Medications: Reviewed on Rounds  Physical Exam:  Vitals: Temperature 97.0 pulse 67 respiratory rate 12 blood pressure 141/73 saturations 98%  Ventilator Settings pressure support FiO2 28% pressure support 5 PEEP 5  . General: Comfortable at this time . Eyes: Grossly normal lids, irises & conjunctiva . ENT: grossly tongue is normal . Neck: no obvious mass . Cardiovascular: S1 S2 normal no gallop . Respiratory: No rhonchi no rales . Abdomen: soft . Skin: no rash seen on limited exam . Musculoskeletal: not rigid . Psychiatric:unable to assess . Neurologic: no seizure no involuntary movements         Lab Data:   Basic Metabolic Panel: Recent Labs  Lab 08/17/18 1046 08/19/18 1002  NA 141 140  K 4.2 4.3  CL 105 110  CO2 27 23  GLUCOSE 89 115*  BUN 25* 15  CREATININE 0.93 0.78  CALCIUM 8.9 9.2    ABG: Recent Labs  Lab 08/16/18 1628 08/18/18 1305 08/23/18 1000  PHART 7.494* 7.431 7.425  PCO2ART 41.8 37.9 35.6  PO2ART 60.5* 77.7* 77.4*  HCO3 32.0* 24.8 23.0  O2SAT 91.9 94.9 95.9    Liver Function Tests: Recent Labs  Lab 08/17/18 1046  AST 107*  ALT 101*  ALKPHOS 108  BILITOT 0.6  PROT 4.9*  ALBUMIN 1.8*   No results for input(s): LIPASE, AMYLASE in the last 168 hours. No results for input(s): AMMONIA in the last 168 hours.  CBC: Recent Labs  Lab 08/17/18 1046 08/18/18 1128  WBC 11.8* 16.1*  NEUTROABS 9.4*  --   HGB 8.4* 9.3*  HCT 26.2* 29.4*   MCV 95.6 95.8  PLT 313 368    Cardiac Enzymes: No results for input(s): CKTOTAL, CKMB, CKMBINDEX, TROPONINI in the last 168 hours.  BNP (last 3 results) No results for input(s): BNP in the last 8760 hours.  ProBNP (last 3 results) No results for input(s): PROBNP in the last 8760 hours.  Radiological Exams: Dg Foot Complete Left  Result Date: 08/22/2018 CLINICAL DATA:  53 year old female with left foot pain. EXAM: LEFT FOOT - COMPLETE 3+ VIEW COMPARISON:  None. FINDINGS: Portable AP oblique and cross-table lateral views. Bone mineralization is within normal limits. Normal joint spaces and alignment. No osseous abnormality identified. No soft tissue abnormality identified. IMPRESSION: Negative. Electronically Signed   By: Genevie Ann M.D.   On: 08/22/2018 14:19    Assessment/Plan Active Problems:   Acute on chronic respiratory failure with hypoxia (HCC)   Polysubstance dependence including opioid type drug with complication, episodic abuse ([place])   Aspiration pneumonia due to gastric secretions (HCC)   Obstructive chronic bronchitis without exacerbation (HCC)   History of DVT of lower extremity   1. Acute on chronic respiratory failure with hypoxia plan is going to be to extubate today she is awake and alert wants the tube out we will continue to monitor post extubation.  The ABG that was done did not show hypercapnia 2. Polysubstance abuse we will continue with supportive  care pain management 3. Aspiration pneumonia treated we will continue with supportive care 4. Obstructive chronic bronchitis at baseline continue with supportive care 5. DVT treated   I have personally seen and evaluated the patient, evaluated laboratory and imaging results, formulated the assessment and plan and placed orders. The Patient requires high complexity decision making for assessment and support.  Case was discussed on Rounds with the Respiratory Therapy Staff  Yevonne PaxSaadat A Khan, MD [facility] Pulmonary Critical  Care Medicine Sleep Medicine

## 2018-08-24 DIAGNOSIS — Z86718 Personal history of other venous thrombosis and embolism: Secondary | ICD-10-CM | POA: Diagnosis not present

## 2018-08-24 DIAGNOSIS — J449 Chronic obstructive pulmonary disease, unspecified: Secondary | ICD-10-CM | POA: Diagnosis not present

## 2018-08-24 DIAGNOSIS — J9621 Acute and chronic respiratory failure with hypoxia: Secondary | ICD-10-CM | POA: Diagnosis not present

## 2018-08-24 DIAGNOSIS — J690 Pneumonitis due to inhalation of food and vomit: Secondary | ICD-10-CM | POA: Diagnosis not present

## 2018-08-24 NOTE — Progress Notes (Addendum)
Pulmonary Critical Care Medicine [place]   PULMONARY CRITICAL CARE SERVICE  PROGRESS NOTE  Date of Service: 08/24/2018  Dana Parker  [id number]  DOB: 1965-07-12   DOA: 08/16/2018  Referring Physician: Merton Border, MD  HPI: Dana Parker is a 53 y.o. female seen for follow up of Acute on Chronic Respiratory Failure.  Patient was extubated yesterday and remains on liters of oxygen by nasal cannula today.  Currently doing well no fever or distress noted.  Medications: Reviewed on Rounds  Physical Exam:  Vitals: Pulse 71 respirations 19 BP 110/71 O2 sat 99% temp 97.3  Ventilator Settings 2 L nasal cannula  . General: Comfortable at this time . Eyes: Grossly normal lids, irises & conjunctiva . ENT: grossly tongue is normal . Neck: no obvious mass . Cardiovascular: S1 S2 normal no gallop . Respiratory: No rales or rhonchi noted . Abdomen: soft . Skin: no rash seen on limited exam . Musculoskeletal: not rigid . Psychiatric:unable to assess . Neurologic: no seizure no involuntary movements         Lab Data:   Basic Metabolic Panel: Recent Labs  Lab 08/19/18 1002  NA 140  K 4.3  CL 110  CO2 23  GLUCOSE 115*  BUN 15  CREATININE 0.78  CALCIUM 9.2    ABG: Recent Labs  Lab 08/18/18 1305 08/23/18 1000  PHART 7.431 7.425  PCO2ART 37.9 35.6  PO2ART 77.7* 77.4*  HCO3 24.8 23.0  O2SAT 94.9 95.9    Liver Function Tests: No results for input(s): AST, ALT, ALKPHOS, BILITOT, PROT, ALBUMIN in the last 168 hours. No results for input(s): LIPASE, AMYLASE in the last 168 hours. No results for input(s): AMMONIA in the last 168 hours.  CBC: Recent Labs  Lab 08/18/18 1128  WBC 16.1*  HGB 9.3*  HCT 29.4*  MCV 95.8  PLT 368    Cardiac Enzymes: No results for input(s): CKTOTAL, CKMB, CKMBINDEX, TROPONINI in the last 168 hours.  BNP (last 3 results) No results for input(s): BNP in the last 8760 hours.  ProBNP (last 3 results) No  results for input(s): PROBNP in the last 8760 hours.  Radiological Exams: No results found.  Assessment/Plan Active Problems:   Acute on chronic respiratory failure with hypoxia (HCC)   Polysubstance dependence including opioid type drug with complication, episodic abuse ([place])   Aspiration pneumonia due to gastric secretions (HCC)   Obstructive chronic bronchitis without exacerbation (HCC)   History of DVT of lower extremity   1. Acute on chronic respiratory hypoxia patient was extubated yesterday with no difficulty.  Continues on nurse nasal cannula at 2 L/min.  Continue supportive measures pulmonary toilet 2. Polysubstance abuse continue supportive care pain management 3. Aspiration pneumonia treated continue supportive care 4. Obstructive chronic bronchitis at baseline continue supportive care 5. DVT treated continue to monitor   I have personally seen and evaluated the patient, evaluated laboratory and imaging results, formulated the assessment and plan and placed orders. The Patient requires high complexity decision making for assessment and support.  Case was discussed on Rounds with the Respiratory Therapy Staff  Allyne Gee, MD [facility] Pulmonary Critical Care Medicine Sleep Medicine

## 2018-08-25 ENCOUNTER — Other Ambulatory Visit (HOSPITAL_COMMUNITY): Payer: Medicare Other

## 2018-08-25 DIAGNOSIS — J449 Chronic obstructive pulmonary disease, unspecified: Secondary | ICD-10-CM | POA: Diagnosis not present

## 2018-08-25 DIAGNOSIS — Z86718 Personal history of other venous thrombosis and embolism: Secondary | ICD-10-CM | POA: Diagnosis not present

## 2018-08-25 DIAGNOSIS — J690 Pneumonitis due to inhalation of food and vomit: Secondary | ICD-10-CM | POA: Diagnosis not present

## 2018-08-25 DIAGNOSIS — J9621 Acute and chronic respiratory failure with hypoxia: Secondary | ICD-10-CM | POA: Diagnosis not present

## 2018-08-25 LAB — CBC
HCT: 38.9 % (ref 36.0–46.0)
Hemoglobin: 12.8 g/dL (ref 12.0–15.0)
MCH: 30.6 pg (ref 26.0–34.0)
MCHC: 32.9 g/dL (ref 30.0–36.0)
MCV: 93.1 fL (ref 80.0–100.0)
Platelets: 404 10*3/uL — ABNORMAL HIGH (ref 150–400)
RBC: 4.18 MIL/uL (ref 3.87–5.11)
RDW: 14.3 % (ref 11.5–15.5)
WBC: 17.1 10*3/uL — ABNORMAL HIGH (ref 4.0–10.5)
nRBC: 0 % (ref 0.0–0.2)

## 2018-08-25 LAB — BASIC METABOLIC PANEL
Anion gap: 8 (ref 5–15)
BUN: 15 mg/dL (ref 6–20)
CO2: 19 mmol/L — ABNORMAL LOW (ref 22–32)
Calcium: 10.2 mg/dL (ref 8.9–10.3)
Chloride: 110 mmol/L (ref 98–111)
Creatinine, Ser: 0.63 mg/dL (ref 0.44–1.00)
GFR calc Af Amer: >60 mL/min (ref >60)
GFR calc non Af Amer: >60 mL/min (ref >60)
Glucose, Bld: 87 mg/dL (ref 70–99)
Potassium: 3.9 mmol/L (ref 3.5–5.1)
Sodium: 137 mmol/L (ref 135–145)

## 2018-08-25 NOTE — Progress Notes (Addendum)
Pulmonary Critical Care Medicine [place]   PULMONARY CRITICAL CARE SERVICE  PROGRESS NOTE  Date of Service: 08/25/2018  Dana Parker  [id number]  DOB: 1965-09-05   DOA: 08/16/2018  Referring Physician: Merton Border, MD  HPI: Dana Parker is a 53 y.o. female seen for follow up of Acute on Chronic Respiratory Failure.  Patient remains on 2 L of oxygen via nasal cannula satting well with no distress.  Medications: Reviewed on Rounds  Physical Exam:  Vitals: Pulse 62 range 24 BP 148/82 O2 sat 99% temp 97.4  Ventilator Settings 2 L nasal cannula  . General: Comfortable at this time . Eyes: Grossly normal lids, irises & conjunctiva . ENT: grossly tongue is normal . Neck: no obvious mass . Cardiovascular: S1 S2 normal no gallop . Respiratory: No rales rhonchi noted . Abdomen: soft . Skin: no rash seen on limited exam . Musculoskeletal: not rigid . Psychiatric:unable to assess . Neurologic: no seizure no involuntary movements         Lab Data:   Basic Metabolic Panel: Recent Labs  Lab 08/19/18 1002 08/25/18 1124  NA 140 137  K 4.3 3.9  CL 110 110  CO2 23 19*  GLUCOSE 115* 87  BUN 15 15  CREATININE 0.78 0.63  CALCIUM 9.2 10.2    ABG: Recent Labs  Lab 08/23/18 1000  PHART 7.425  PCO2ART 35.6  PO2ART 77.4*  HCO3 23.0  O2SAT 95.9    Liver Function Tests: No results for input(s): AST, ALT, ALKPHOS, BILITOT, PROT, ALBUMIN in the last 168 hours. No results for input(s): LIPASE, AMYLASE in the last 168 hours. No results for input(s): AMMONIA in the last 168 hours.  CBC: Recent Labs  Lab 08/25/18 1124  WBC 17.1*  HGB 12.8  HCT 38.9  MCV 93.1  PLT 404*    Cardiac Enzymes: No results for input(s): CKTOTAL, CKMB, CKMBINDEX, TROPONINI in the last 168 hours.  BNP (last 3 results) No results for input(s): BNP in the last 8760 hours.  ProBNP (last 3 results) No results for input(s): PROBNP in the last 8760  hours.  Radiological Exams: Dg Chest Port 1 View  Result Date: 08/25/2018 CLINICAL DATA:  Respiratory failure EXAM: PORTABLE CHEST 1 VIEW COMPARISON:  08/16/2010 FINDINGS: Interval extubation. Right PICC in stable position with tip at the upper cavoatrial junction. Hyperinflation and apical emphysema with interstitial coarsening. Lower lobe aeration has improved from prior. Reticular density asymmetric to the left apex is stable from 2018. Mild left perihilar scarring. No effusion or pneumothorax. Normal heart size. IMPRESSION: COPD with improved inflation since 08/16/2018. Electronically Signed   By: Monte Fantasia M.D.   On: 08/25/2018 08:18    Assessment/Plan Active Problems:   Acute on chronic respiratory failure with hypoxia (HCC)   Polysubstance dependence including opioid type drug with complication, episodic abuse ([place])   Aspiration pneumonia due to gastric secretions (HCC)   Obstructive chronic bronchitis without exacerbation (HCC)   History of DVT of lower extremity   1. Acute on chronic respiratory hypoxia patient was extubated yesterday with no difficulty.  Continues on nurse nasal cannula at 2 L/min.  Continue supportive measures pulmonary toilet 2. Polysubstance abuse continue supportive care pain management 3. Aspiration pneumonia treated continue supportive care 4. Obstructive chronic bronchitis at baseline continue supportive care 5. DVT treated continue to monitor   I have personally seen and evaluated the patient, evaluated laboratory and imaging results, formulated the assessment and plan and placed orders. The Patient requires high  complexity decision making for assessment and support.  Case was discussed on Rounds with the Respiratory Therapy Staff  Allyne Gee, MD [facility] Pulmonary Critical Care Medicine Sleep Medicine

## 2018-08-30 LAB — CBC
HCT: 38.6 % (ref 36.0–46.0)
Hemoglobin: 12.5 g/dL (ref 12.0–15.0)
MCH: 30.8 pg (ref 26.0–34.0)
MCHC: 32.4 g/dL (ref 30.0–36.0)
MCV: 95.1 fL (ref 80.0–100.0)
Platelets: 346 10*3/uL (ref 150–400)
RBC: 4.06 MIL/uL (ref 3.87–5.11)
RDW: 14.2 % (ref 11.5–15.5)
WBC: 15.3 10*3/uL — ABNORMAL HIGH (ref 4.0–10.5)
nRBC: 0 % (ref 0.0–0.2)

## 2018-08-30 LAB — BASIC METABOLIC PANEL
Anion gap: 11 (ref 5–15)
BUN: 15 mg/dL (ref 6–20)
CO2: 16 mmol/L — ABNORMAL LOW (ref 22–32)
Calcium: 10.3 mg/dL (ref 8.9–10.3)
Chloride: 114 mmol/L — ABNORMAL HIGH (ref 98–111)
Creatinine, Ser: 0.67 mg/dL (ref 0.44–1.00)
GFR calc Af Amer: >60 mL/min (ref >60)
GFR calc non Af Amer: >60 mL/min (ref >60)
Glucose, Bld: 102 mg/dL — ABNORMAL HIGH (ref 70–99)
Potassium: 2.6 mmol/L — CL (ref 3.5–5.1)
Sodium: 141 mmol/L (ref 135–145)

## 2018-08-31 LAB — POTASSIUM: Potassium: 3.7 mmol/L (ref 3.5–5.1)

## 2018-09-06 LAB — SARS CORONAVIRUS 2 BY RT PCR (HOSPITAL ORDER, PERFORMED IN ~~LOC~~ HOSPITAL LAB): SARS Coronavirus 2: NEGATIVE

## 2020-02-01 IMAGING — DX ABDOMEN - 1 VIEW
1 series · 1 of 1 positions shown · non-contrast
Comparison: None.

CLINICAL DATA: Orogastric tube placement.

EXAM:
ABDOMEN - 1 VIEW

[abdomen kub]
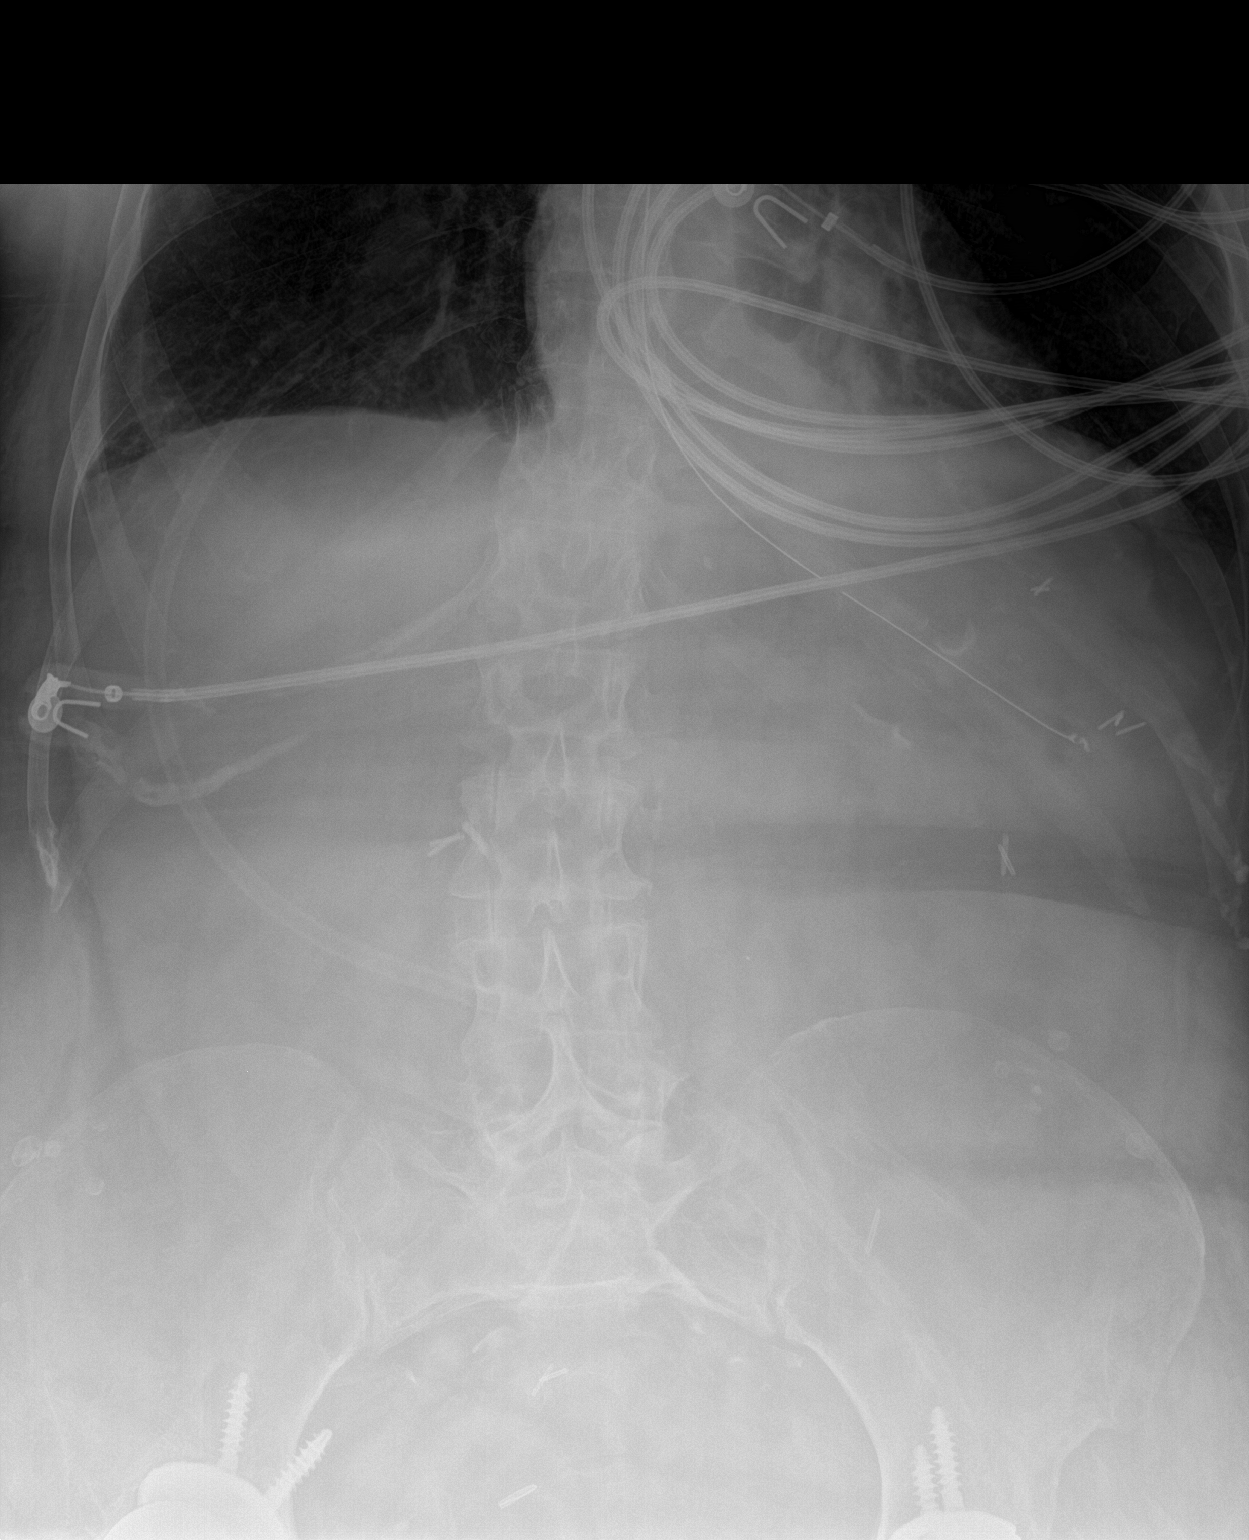

[1 of 1 positions shown; findings below may reference images not displayed]

FINDINGS: Tip and side port of the enteric tube below the diaphragm in the
stomach. Overall paucity of bowel gas in the abdomen. Surgical clips
in the right and left abdomen. There are vascular calcifications.
Bilateral hip arthroplasties are partially included.
IMPRESSION: Tip and side port of the enteric tube below the diaphragm in the
stomach.

## 2020-02-07 IMAGING — DX LEFT FOOT - COMPLETE 3+ VIEW
2 series · 3 of 3 positions shown · non-contrast
Comparison: None.

CLINICAL DATA: 52-year-old female with left foot pain.

EXAM:
LEFT FOOT - COMPLETE 3+ VIEW

[Series 1: foot · 0.14mm/px · 2 of 2 slices shown]
[im 1/2]
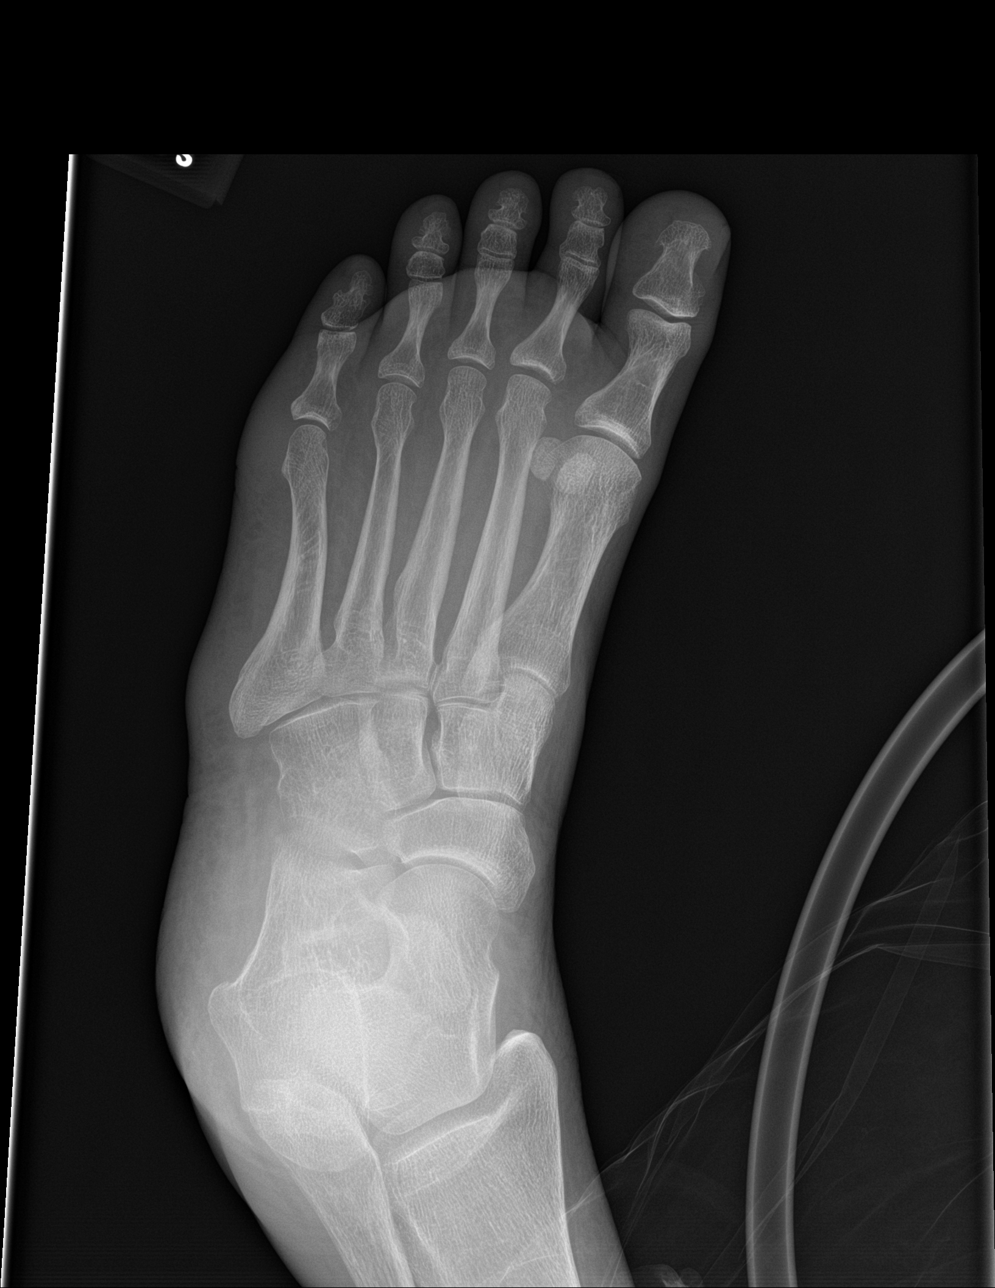
[im 2/2]
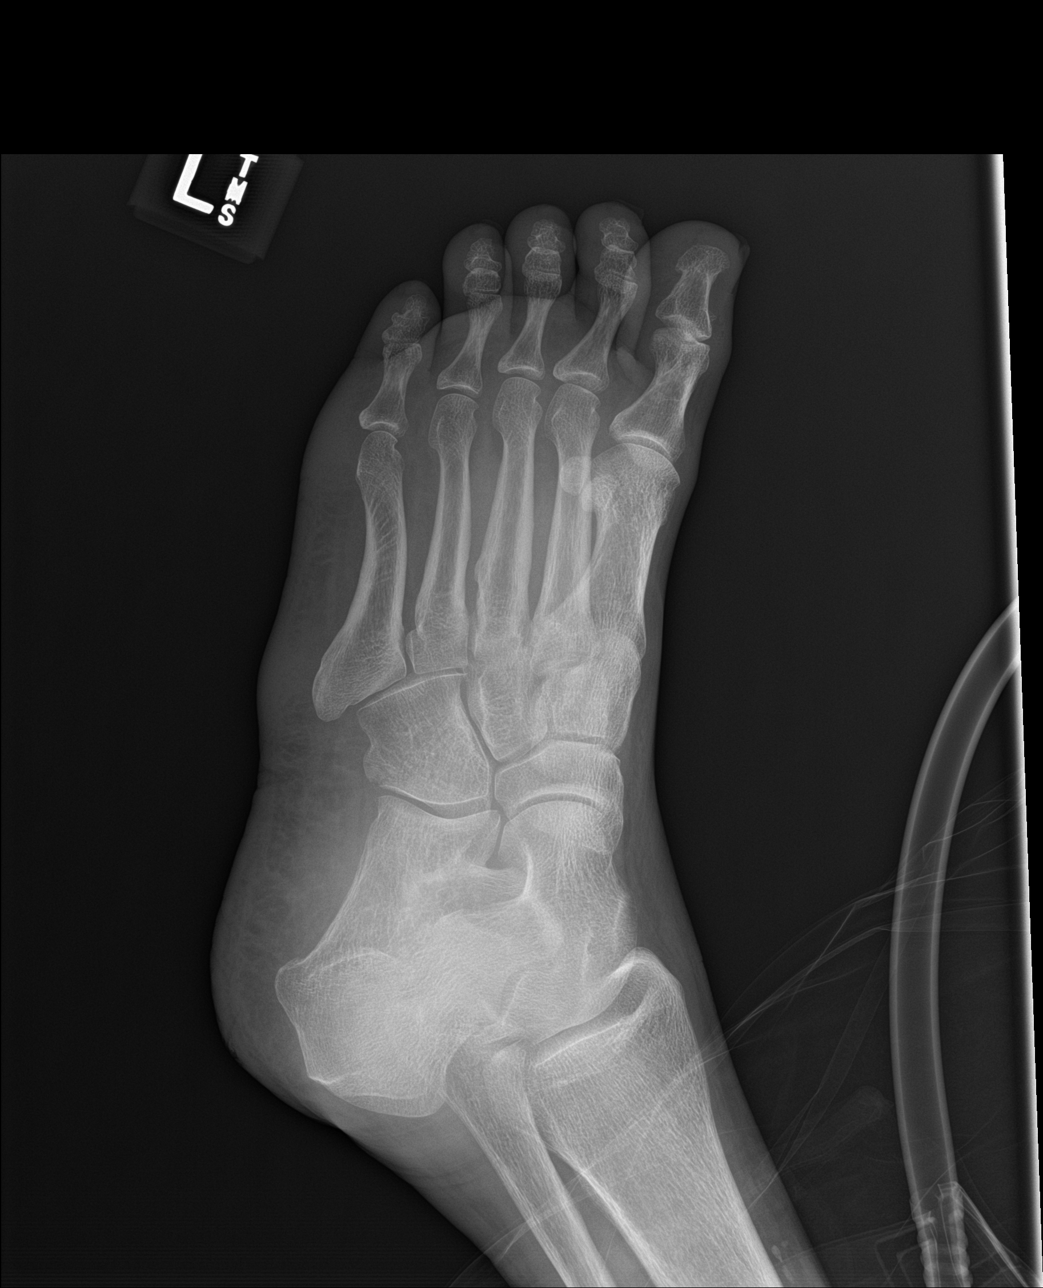

[leg]
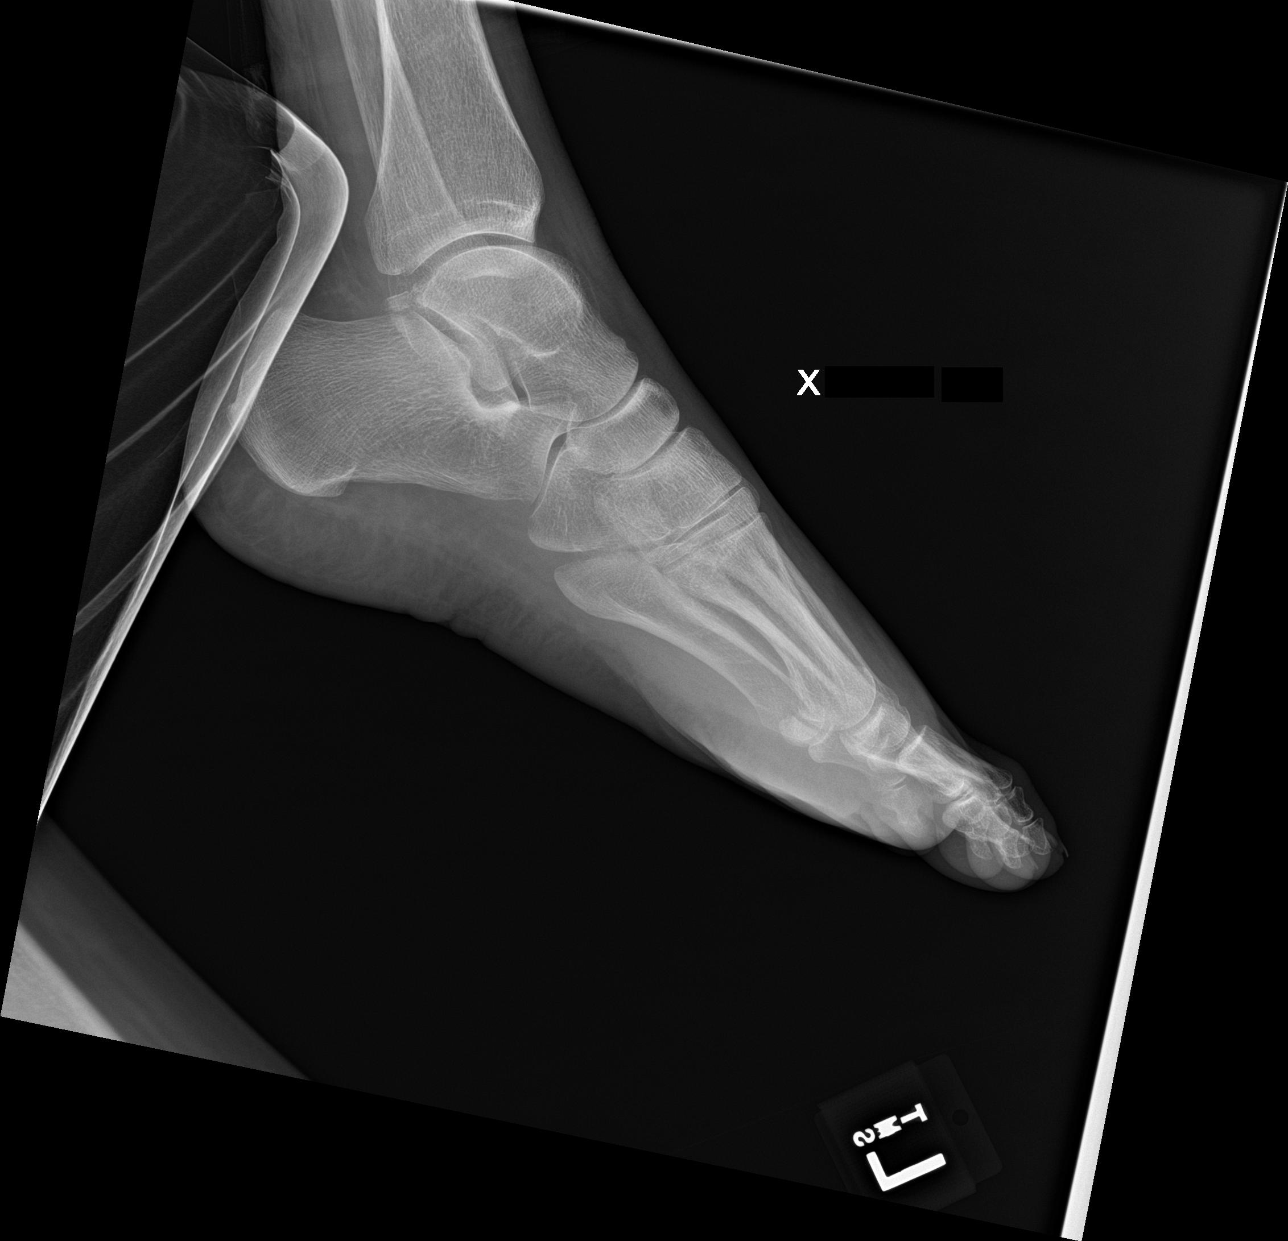

[3 of 3 positions shown; findings below may reference images not displayed]

FINDINGS: Portable AP oblique and cross-table lateral views. Bone
mineralization is within normal limits. Normal joint spaces and
alignment. No osseous abnormality identified. No soft tissue
abnormality identified.
IMPRESSION: Negative.

## 2020-05-28 DEATH — deceased
